# Patient Record
Sex: Female | Born: 1955 | Race: White | Hispanic: No | Marital: Married | Smoking: Never smoker
Health system: Southern US, Community
[De-identification: ages and names within clinical notes are randomized; demographics above are authoritative.]

---

## 2019-02-25 ENCOUNTER — Other Ambulatory Visit: Payer: Self-pay | Admitting: Nurse Practitioner

## 2019-02-25 DIAGNOSIS — S098XXD Other specified injuries of head, subsequent encounter: Secondary | ICD-10-CM

## 2019-03-04 ENCOUNTER — Ambulatory Visit (INDEPENDENT_AMBULATORY_CARE_PROVIDER_SITE_OTHER): Payer: TRICARE For Life (TFL) | Admitting: Podiatry

## 2019-03-04 ENCOUNTER — Encounter: Payer: Self-pay | Admitting: Podiatry

## 2019-03-04 ENCOUNTER — Other Ambulatory Visit: Payer: Self-pay

## 2019-03-04 ENCOUNTER — Ambulatory Visit (INDEPENDENT_AMBULATORY_CARE_PROVIDER_SITE_OTHER): Payer: TRICARE For Life (TFL)

## 2019-03-04 VITALS — BP 143/78 | HR 70 | Temp 97.5°F | Resp 16

## 2019-03-04 DIAGNOSIS — M7661 Achilles tendinitis, right leg: Secondary | ICD-10-CM

## 2019-03-04 DIAGNOSIS — M722 Plantar fascial fibromatosis: Secondary | ICD-10-CM

## 2019-03-04 MED ORDER — MELOXICAM 15 MG PO TABS: 15.0000 mg | ORAL_TABLET | Freq: Every day | ORAL | 3 refills | Status: AC

## 2019-03-04 MED ORDER — METHYLPREDNISOLONE 4 MG PO TBPK: ORAL_TABLET | ORAL | 0 refills | Status: DC

## 2019-03-04 NOTE — Patient Instructions (Signed)

## 2019-03-05 ENCOUNTER — Encounter: Payer: Self-pay | Admitting: Podiatry

## 2019-03-05 NOTE — Progress Notes (Signed)
  Subjective:  Patient ID: Laura Serrano, female    DOB: November 19, 1955,  MRN: [id number] HPI Chief Complaint  Patient presents with  . Foot Pain    Posterior heel right - aching x several months, AM pain, knot, swelling, tried stretching  . Foot Pain    Arch right - just noticed knot few months ago and has gotten larger   . New Patient (Initial Visit)    63 y.o. female presents with the above complaint.   ROS: Denies fever chills nausea vomiting muscle aches pains calf pain back pain chest pain shortness of breath.  No past medical history on file.   Current Outpatient Medications:  .  aspirin-acetaminophen-caffeine (EXCEDRIN MIGRAINE) 250-250-65 MG tablet, Take by mouth every 6 (six) hours as needed for headache., Disp: , Rfl:  .  meloxicam (MOBIC) 15 MG tablet, Take 1 tablet (15 mg total) by mouth daily., Disp: 30 tablet, Rfl: 3 .  methylPREDNISolone (MEDROL DOSEPAK) 4 MG TBPK tablet, 6 day dose pack - take as directed, Disp: 21 tablet, Rfl: 0  Allergies  Allergen Reactions  . Lidocaine   . Penicillins    Review of Systems Objective:   Vitals:   03/04/19 1637  BP: (!) 143/78  Pulse: 70  Resp: 16  Temp: (!) 97.5 F (36.4 C)    General: Well developed, nourished, in no acute distress, alert and oriented x3   Dermatological: Skin is warm, dry and supple bilateral. Nails x 10 are well maintained; remaining integument appears unremarkable at this time. There are no open sores, no preulcerative lesions, no rash or signs of infection present.  Vascular: Dorsalis Pedis artery and Posterior Tibial artery pedal pulses are 2/4 bilateral with immedate capillary fill time. Pedal hair growth present. No varicosities and no lower extremity edema present bilateral.   Neruologic: Grossly intact via light touch bilateral. Vibratory intact via tuning fork bilateral. Protective threshold with Semmes Wienstein monofilament intact to all pedal sites bilateral. Patellar and Achilles deep tendon  reflexes 2+ bilateral. No Babinski or clonus noted bilateral.   Musculoskeletal: No gross boney pedal deformities bilateral. No pain, crepitus, or limitation noted with foot and ankle range of motion bilateral. Muscular strength 5/5 in all groups tested bilateral.  Large nonpulsatile nodule posterior aspect of the right heel and a very small plantar fibroma medial longitudinal arch.  She has good dorsiflexion of the right foot with the knee straight just tenderness on palpation.  It appears to be somewhat boggy much like a bursitis.  Gait: Unassisted, Nonantalgic.    Radiographs:  Radiographs taken today demonstrate a large retrocalcaneal heel spur soft tissue calcification and swelling of the Achilles as it inserts on the posterior superior aspect of the calcaneus.  Assessment & Plan:   Assessment: Retrocalcaneal bursitis distal Achilles tendinitis retrocalcaneal heel spur.  Plantar fibroma right.  Plan: After sterile Betadine skin prep I injected 2 mg of dexamethasone local anesthetic to the right heel.  Placed her in a night splint discussed appropriate shoe gear stretching exercises ice therapy sugar modifications.  We will follow-up with her in about a month may need to consider MRI     Max T. Carter, [place]

## 2019-03-07 ENCOUNTER — Ambulatory Visit
Admission: RE | Admit: 2019-03-07 | Discharge: 2019-03-07 | Disposition: A | Discharge status: Home / Self Care | Payer: TRICARE For Life (TFL) | Source: Ambulatory Visit | Attending: Nurse Practitioner | Admitting: Nurse Practitioner

## 2019-03-07 ENCOUNTER — Encounter: Payer: Self-pay | Admitting: Radiology

## 2019-03-07 DIAGNOSIS — S098XXD Other specified injuries of head, subsequent encounter: Secondary | ICD-10-CM

## 2019-03-07 IMAGING — CT CT HEAD WITHOUT AND WITH CONTRAST
4 of 5 series · 16 of 47 positions shown, 18 images · IV contrast (iopamidol)
Comparison: No prior studies available for comparison.

CLINICAL DATA: Other specified injuries of head, subsequent
encounter. Additional history: Supraorbital pain now with numbness
since December 2018, hit right-side of head December 2018.

EXAM:
CT HEAD WITHOUT AND WITH CONTRAST
TECHNIQUE: Contiguous axial images were obtained from the base of the skull
through the vertex without and with intravenous contrast
CONTRAST:  75mL XXHWO8-6EE IOPAMIDOL (XXHWO8-6EE) INJECTION 61%

[Series 2: brain 5.00 hr40 s3 ibhc · axial · 0.43mm/px · z∈[-589,-469]mm · 8 of 32 slices shown, 10 images]
[im 4/32  brain]
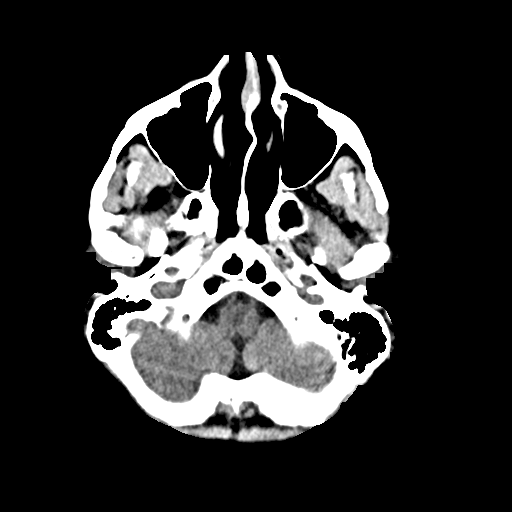
[im 4/32  bone]
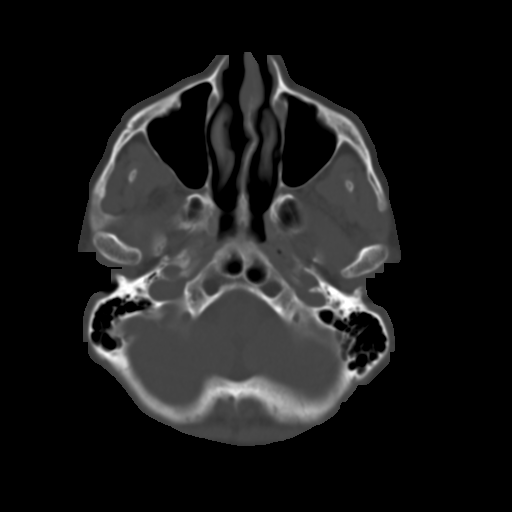
[im 7/32  brain]
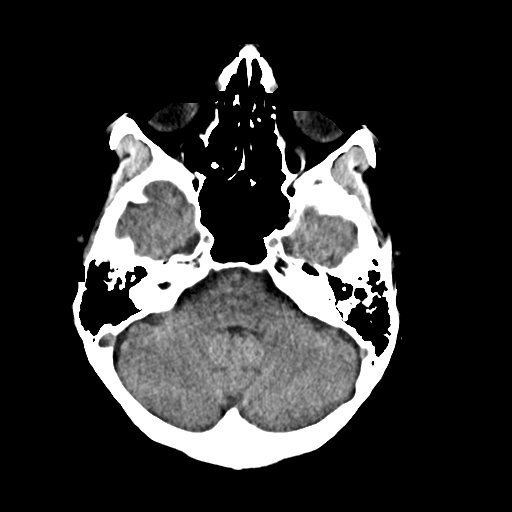
[im 11/32  brain]
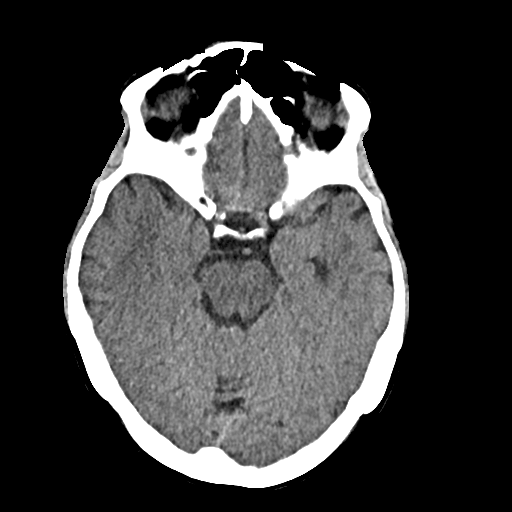
[im 14/32  brain]
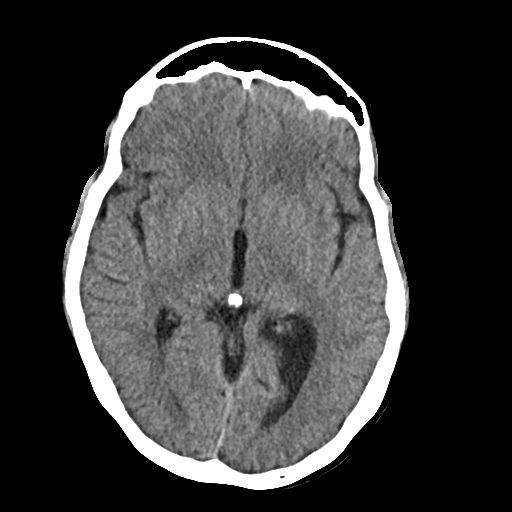
[im 18/32  brain]
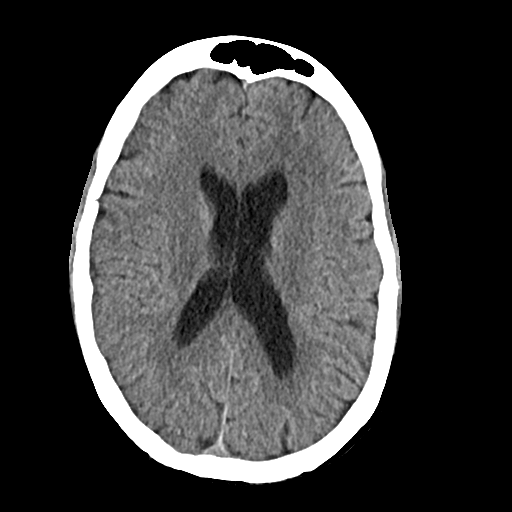
[im 18/32  bone]
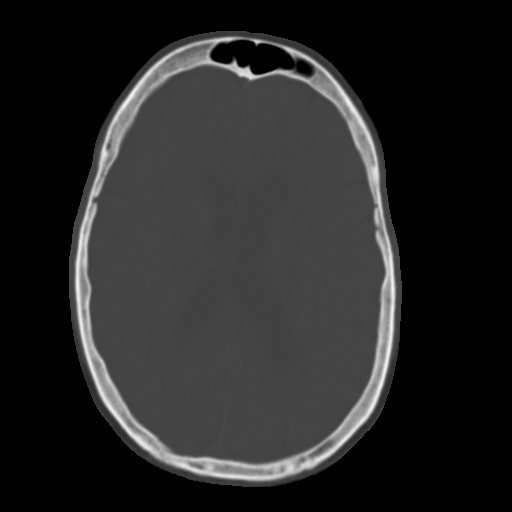
[im 21/32  brain]
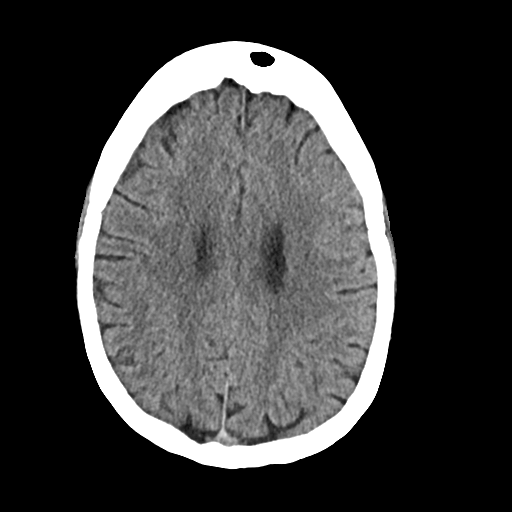
[im 25/32  brain]
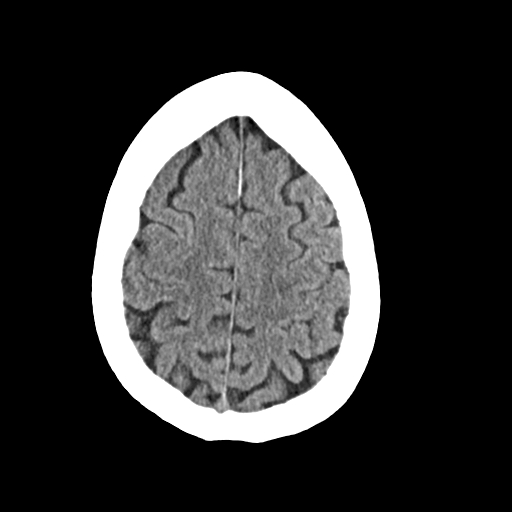
[im 28/32  brain]
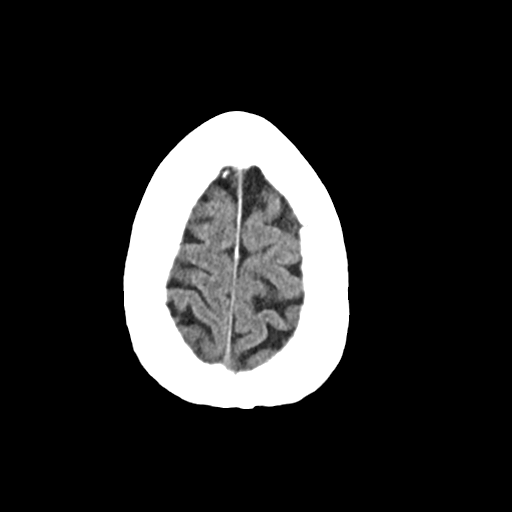

[Series 3: brain 5.00 hr60 s3 axial · axial · 0.43mm/px · z∈[-589,-574]mm · 2 of 32 slices shown]
[im 4/32  brain]
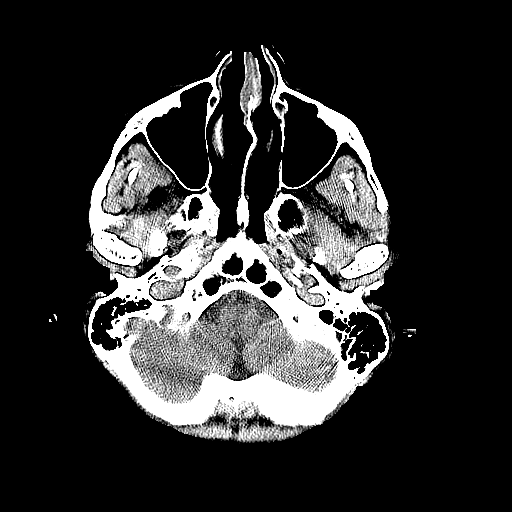
[im 7/32  brain]
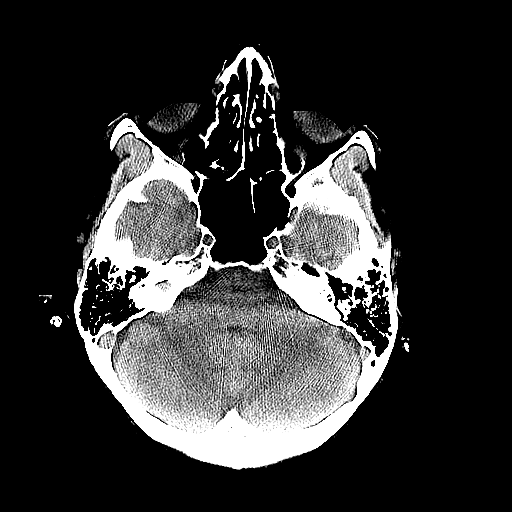

[Series 5: brain 3.00 hr40 s3 cor ibhc · coronal · 0.33mm/px · 3 of 71 slices shown]
[im 24/71  brain]
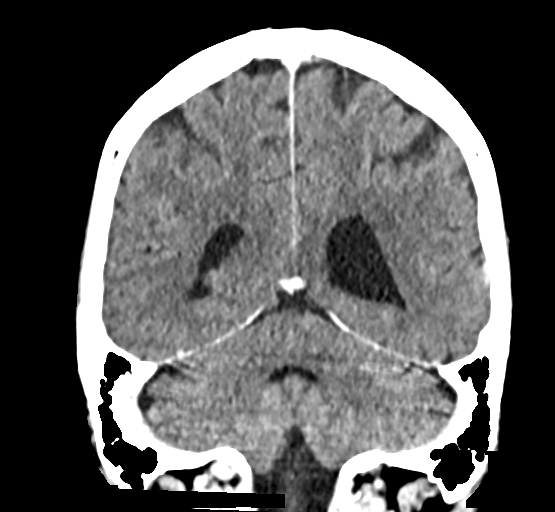
[im 32/71  brain]
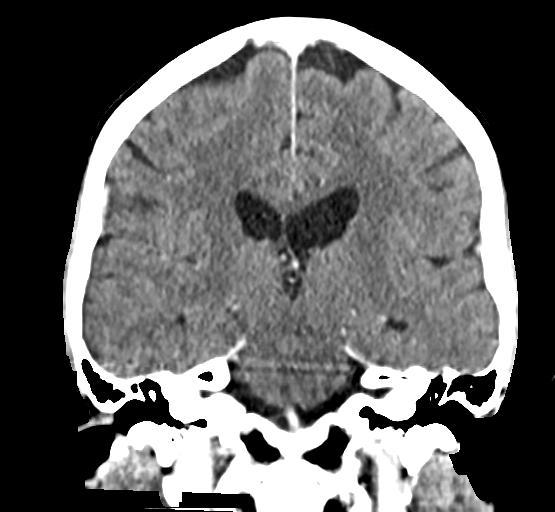
[im 39/71  brain]
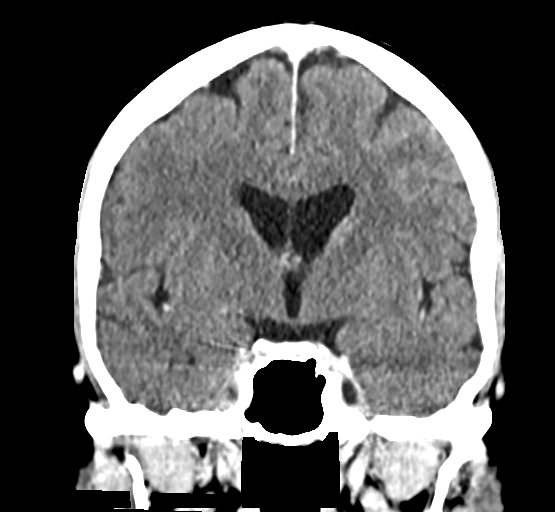

[Series 7: brain 3.00 hr40 s3 sag ibhc · sagittal · 0.33mm/px · 3 of 60 slices shown]
[im 20/60  brain]
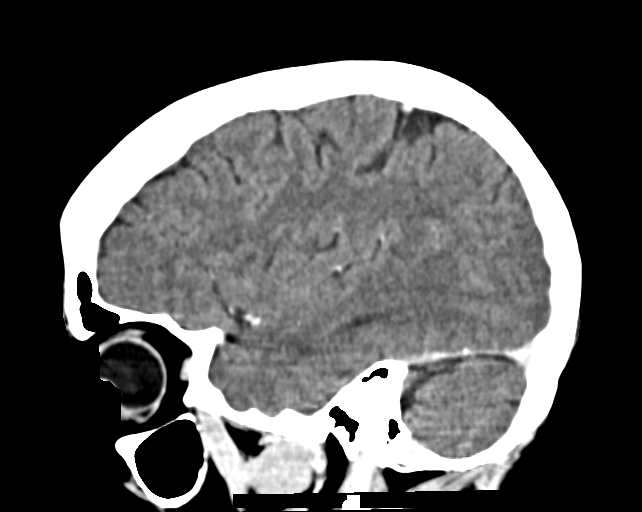
[im 30/60  brain]
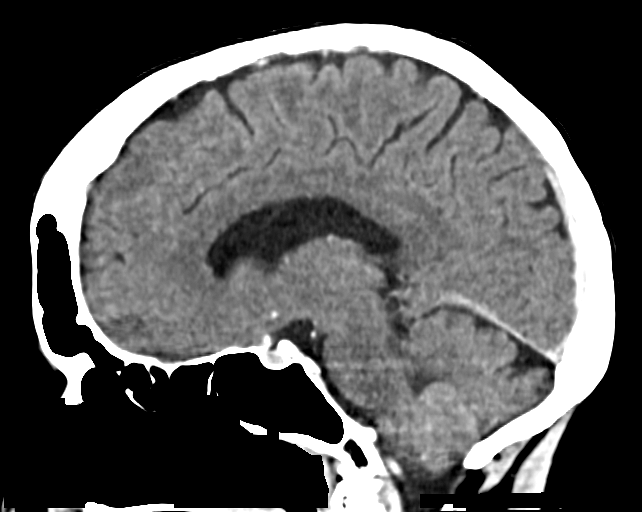
[im 40/60  brain]
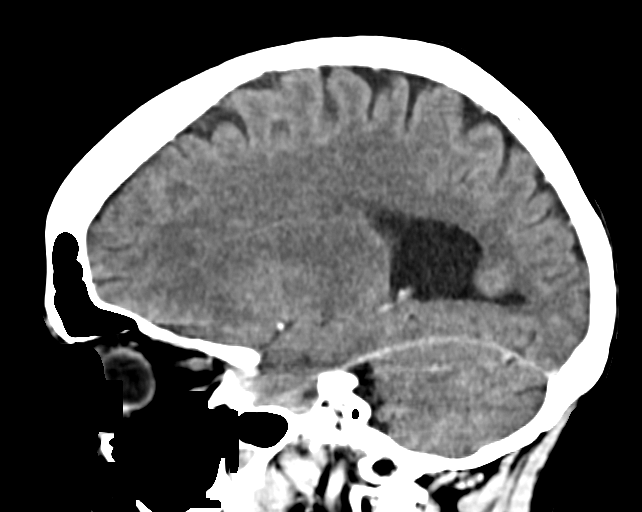

[16 of 47 positions shown; findings below may reference images not displayed]

FINDINGS: Brain: Pre contrast images demonstrate no acute intracranial
hemorrhage or demarcated territorial infarction. No evidence of
intracranial mass. No midline shift or extra-axial collection. No
abnormal intracranial enhancement. Cerebral volume is age
appropriate.

Vascular: Atherosclerotic calcification of the carotid artery
siphons. No hyperdense vessel. Expected enhancement of the proximal
large vessels.

Skull: Normal. Negative for fracture or focal lesion.

Sinuses/Orbits: The globes and orbits are unremarkable. The imaged
paranasal sinuses and mastoid air cells are well aerated.
IMPRESSION: No evidence of intracranial mass or acute intracranial abnormality.
No cause of patient's symptoms identified.

## 2019-03-07 MED ORDER — IOPAMIDOL (ISOVUE-300) INJECTION 61%
75.0000 mL | Freq: Once | INTRAVENOUS | Status: AC | PRN
  Administered 2019-03-07: 75 mL via INTRAVENOUS

## 2019-04-15 ENCOUNTER — Ambulatory Visit (INDEPENDENT_AMBULATORY_CARE_PROVIDER_SITE_OTHER): Admitting: Podiatry

## 2019-04-15 ENCOUNTER — Encounter: Payer: Self-pay | Admitting: Podiatry

## 2019-04-15 ENCOUNTER — Other Ambulatory Visit: Payer: Self-pay

## 2019-04-15 DIAGNOSIS — M722 Plantar fascial fibromatosis: Secondary | ICD-10-CM | POA: Diagnosis not present

## 2019-04-15 DIAGNOSIS — M7661 Achilles tendinitis, right leg: Secondary | ICD-10-CM

## 2019-04-16 NOTE — Progress Notes (Signed)
She presents today states that is doing a whole lot better than it was.  She refers to the Achilles tendinitis of the right foot.  Objective: Vital signs are stable alert and oriented x3.  Pulses are palpable.  She still has some tenderness on palpation of the posterior aspect of the calcaneus at the insertion site of the Achilles.  There appears to be a small area of bursitis there.  Assessment: Retrocalcaneal bursitis Achilles tendinitis.  Plan: I injected another dose of dexamethasone 2 mg to the point of maximal tenderness.  She tolerated procedure well without complications instructed her to continue all conservative therapies braces and anti-inflammatories follow-up with me in 1 month

## 2019-05-27 ENCOUNTER — Ambulatory Visit: Admitting: Podiatry

## 2019-11-13 ENCOUNTER — Ambulatory Visit: Attending: Internal Medicine

## 2019-11-13 DIAGNOSIS — Z23 Encounter for immunization: Secondary | ICD-10-CM

## 2019-12-08 ENCOUNTER — Ambulatory Visit: Attending: Internal Medicine

## 2019-12-08 DIAGNOSIS — Z23 Encounter for immunization: Secondary | ICD-10-CM

## 2019-12-08 NOTE — Progress Notes (Signed)
   Covid-19 Vaccination Clinic  Name:  Aury Scollard    MRN: [id number] DOB: 06-05-1956  12/08/2019  Ms. Sarris was observed post Covid-19 immunization for 15 minutes without incident. She was provided with Vaccine Information Sheet and instruction to access the V-Safe system.   Ms. Micalizzi was instructed to call 911 with any severe reactions post vaccine: Marland Kitchen Difficulty breathing  . Swelling of face and throat  . A fast heartbeat  . A bad rash all over body  . Dizziness and weakness   Immunizations Administered    Name Date Dose VIS Date Route   Pfizer COVID-19 Vaccine 12/08/2019 12:03 PM 0.3 mL 10/08/2018 Intramuscular   Manufacturer: [employer], [employer]   Lot: [id number]   NDC: [address]-6
